# Patient Record
Sex: Male | Born: 1985 | Race: Black or African American | Hispanic: No | Marital: Single
Health system: Southern US, Community
[De-identification: ages and names within clinical notes are randomized; demographics above are authoritative.]

---

## 2020-10-21 ENCOUNTER — Emergency Department (HOSPITAL_COMMUNITY): Payer: 59

## 2020-10-21 ENCOUNTER — Other Ambulatory Visit: Payer: Self-pay

## 2020-10-21 ENCOUNTER — Emergency Department (HOSPITAL_COMMUNITY)
Admission: EM | Admit: 2020-10-21 | Discharge: 2020-10-22 | Disposition: A | Discharge status: Home / Self Care | Payer: 59 | Attending: Emergency Medicine | Admitting: Emergency Medicine

## 2020-10-21 DIAGNOSIS — Y92410 Unspecified street and highway as the place of occurrence of the external cause: Secondary | ICD-10-CM | POA: Diagnosis not present

## 2020-10-21 DIAGNOSIS — S40211A Abrasion of right shoulder, initial encounter: Secondary | ICD-10-CM | POA: Diagnosis not present

## 2020-10-21 DIAGNOSIS — T07XXXA Unspecified multiple injuries, initial encounter: Secondary | ICD-10-CM

## 2020-10-21 DIAGNOSIS — S4991XA Unspecified injury of right shoulder and upper arm, initial encounter: Secondary | ICD-10-CM | POA: Diagnosis present

## 2020-10-21 DIAGNOSIS — R0789 Other chest pain: Secondary | ICD-10-CM | POA: Insufficient documentation

## 2020-10-21 DIAGNOSIS — Z20822 Contact with and (suspected) exposure to covid-19: Secondary | ICD-10-CM | POA: Diagnosis not present

## 2020-10-21 DIAGNOSIS — R000 Tachycardia, unspecified: Secondary | ICD-10-CM | POA: Insufficient documentation

## 2020-10-21 DIAGNOSIS — T1490XA Injury, unspecified, initial encounter: Secondary | ICD-10-CM

## 2020-10-21 DIAGNOSIS — S3991XA Unspecified injury of abdomen, initial encounter: Secondary | ICD-10-CM

## 2020-10-21 DIAGNOSIS — S20419A Abrasion of unspecified back wall of thorax, initial encounter: Secondary | ICD-10-CM | POA: Insufficient documentation

## 2020-10-21 LAB — COMPREHENSIVE METABOLIC PANEL
ALT: 48 U/L — ABNORMAL HIGH (ref 0–44)
AST: 55 U/L — ABNORMAL HIGH (ref 15–41)
Albumin: 3.9 g/dL (ref 3.5–5.0)
Alkaline Phosphatase: 63 U/L (ref 38–126)
Anion gap: 14 (ref 5–15)
BUN: 19 mg/dL (ref 6–20)
CO2: 17 mmol/L — ABNORMAL LOW (ref 22–32)
Calcium: 9.3 mg/dL (ref 8.9–10.3)
Chloride: 107 mmol/L (ref 98–111)
Creatinine, Ser: 1.56 mg/dL — ABNORMAL HIGH (ref 0.61–1.24)
GFR, Estimated: 59 mL/min — ABNORMAL LOW (ref >60)
Glucose, Bld: 124 mg/dL — ABNORMAL HIGH (ref 70–99)
Potassium: 3.3 mmol/L — ABNORMAL LOW (ref 3.5–5.1)
Sodium: 138 mmol/L (ref 135–145)
Total Bilirubin: 0.6 mg/dL (ref 0.3–1.2)
Total Protein: 7.5 g/dL (ref 6.5–8.1)

## 2020-10-21 LAB — CBC
HCT: 44.6 % (ref 39.0–52.0)
Hemoglobin: 14.9 g/dL (ref 13.0–17.0)
MCH: 27.3 pg (ref 26.0–34.0)
MCHC: 33.4 g/dL (ref 30.0–36.0)
MCV: 81.7 fL (ref 80.0–100.0)
Platelets: 410 10*3/uL — ABNORMAL HIGH (ref 150–400)
RBC: 5.46 MIL/uL (ref 4.22–5.81)
RDW: 14.9 % (ref 11.5–15.5)
WBC: 17.1 10*3/uL — ABNORMAL HIGH (ref 4.0–10.5)
nRBC: 0 % (ref 0.0–0.2)

## 2020-10-21 LAB — I-STAT CHEM 8, ED
BUN: 21 mg/dL — ABNORMAL HIGH (ref 6–20)
Calcium, Ion: 1.07 mmol/L — ABNORMAL LOW (ref 1.15–1.40)
Chloride: 108 mmol/L (ref 98–111)
Creatinine, Ser: 1.4 mg/dL — ABNORMAL HIGH (ref 0.61–1.24)
Glucose, Bld: 124 mg/dL — ABNORMAL HIGH (ref 70–99)
HCT: 46 % (ref 39.0–52.0)
Hemoglobin: 15.6 g/dL (ref 13.0–17.0)
Potassium: 3.2 mmol/L — ABNORMAL LOW (ref 3.5–5.1)
Sodium: 142 mmol/L (ref 135–145)
TCO2: 20 mmol/L — ABNORMAL LOW (ref 22–32)

## 2020-10-21 LAB — PROTIME-INR
INR: 1 (ref 0.8–1.2)
Prothrombin Time: 13.2 seconds (ref 11.4–15.2)

## 2020-10-21 LAB — SAMPLE TO BLOOD BANK

## 2020-10-21 LAB — ETHANOL: Alcohol, Ethyl (B): <10 mg/dL (ref <10)

## 2020-10-21 LAB — RESP PANEL BY RT-PCR (FLU A&B, COVID) ARPGX2
Influenza A by PCR: NEGATIVE
Influenza B by PCR: NEGATIVE
SARS Coronavirus 2 by RT PCR: NEGATIVE

## 2020-10-21 MED ORDER — FENTANYL CITRATE (PF) 100 MCG/2ML IJ SOLN
INTRAMUSCULAR | Status: AC
  Filled 2020-10-21: qty 2

## 2020-10-21 MED ORDER — HYDROMORPHONE HCL 1 MG/ML IJ SOLN
INTRAMUSCULAR | Status: AC
  Filled 2020-10-21: qty 1

## 2020-10-21 MED ORDER — FENTANYL CITRATE (PF) 100 MCG/2ML IJ SOLN
INTRAMUSCULAR | Status: AC | PRN
  Administered 2020-10-21 (×2): 100 ug via INTRAVENOUS

## 2020-10-21 MED ORDER — IOHEXOL 300 MG/ML  SOLN
100.0000 mL | Freq: Once | INTRAMUSCULAR | Status: AC | PRN
  Administered 2020-10-21: 100 mL via INTRAVENOUS

## 2020-10-21 MED ORDER — HYDROMORPHONE HCL 1 MG/ML IJ SOLN
INTRAMUSCULAR | Status: AC | PRN
  Administered 2020-10-21: 1 mg via INTRAVENOUS

## 2020-10-21 MED ORDER — ONDANSETRON HCL 4 MG/2ML IJ SOLN
INTRAMUSCULAR | Status: AC
  Administered 2020-10-21: 4 mg
  Filled 2020-10-21: qty 2

## 2020-10-21 MED ORDER — MORPHINE SULFATE (PF) 4 MG/ML IV SOLN
4.0000 mg | Freq: Once | INTRAVENOUS | Status: AC
  Administered 2020-10-21: 4 mg via INTRAVENOUS
  Filled 2020-10-21: qty 1

## 2020-10-21 NOTE — ED Notes (Signed)
Pt transported to CT with TRN and primary RN.

## 2020-10-21 NOTE — ED Notes (Signed)
Pt asking for water   The pt is c.o  Pain all over his body.  Water given to drink
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
## 2020-10-21 NOTE — ED Notes (Signed)
Pt yelling out in pain in CT. MD notified and verbal order for 1mg  dilaudid received.

## 2020-10-21 NOTE — ED Notes (Signed)
Pt c/o pain  Med given
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
## 2020-10-21 NOTE — ED Notes (Signed)
Pt returned from CT at this time.  
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
## 2020-10-22 MED ORDER — HYDROMORPHONE HCL 1 MG/ML IJ SOLN
2.0000 mg | Freq: Once | INTRAMUSCULAR | Status: AC
Start: 2020-10-22 — End: 2020-10-22
  Administered 2020-10-22: 2 mg via INTRAMUSCULAR
  Filled 2020-10-22: qty 2

## 2020-10-22 MED ORDER — OXYCODONE HCL 5 MG PO TABS: 5.0000 mg | ORAL_TABLET | Freq: Four times a day (QID) | ORAL | 0 refills | Status: AC | PRN

## 2020-10-22 MED ORDER — ONDANSETRON HCL 4 MG/2ML IJ SOLN
4.0000 mg | Freq: Once | INTRAMUSCULAR | Status: AC
  Administered 2020-10-22: 4 mg via INTRAVENOUS
  Filled 2020-10-22: qty 2

## 2020-10-22 MED ORDER — IBUPROFEN 800 MG PO TABS: 800.0000 mg | ORAL_TABLET | Freq: Three times a day (TID) | ORAL | 0 refills | Status: AC

## 2020-10-22 MED ORDER — OXYCODONE-ACETAMINOPHEN 5-325 MG PO TABS
2.0000 | ORAL_TABLET | Freq: Once | ORAL | Status: AC
  Administered 2020-10-22: 2 via ORAL
  Filled 2020-10-22: qty 2

## 2020-10-22 MED ORDER — KETOROLAC TROMETHAMINE 30 MG/ML IJ SOLN
30.0000 mg | Freq: Once | INTRAMUSCULAR | Status: AC
  Administered 2020-10-22: 30 mg via INTRAVENOUS
  Filled 2020-10-22: qty 1

## 2020-10-22 MED ORDER — ACETAMINOPHEN 325 MG PO TABS: 650.0000 mg | ORAL_TABLET | Freq: Three times a day (TID) | ORAL | 0 refills | Status: AC
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
